# Patient Record
Sex: Male | Born: 1955 | Race: White | Hispanic: No | Marital: Married | Smoking: Former smoker
Health system: Southern US, Community
[De-identification: ages and names within clinical notes are randomized; demographics above are authoritative.]

## PROBLEM LIST (undated history)

## (undated) DIAGNOSIS — F32A Depression, unspecified: Secondary | ICD-10-CM

## (undated) DIAGNOSIS — E039 Hypothyroidism, unspecified: Secondary | ICD-10-CM

## (undated) DIAGNOSIS — E559 Vitamin D deficiency, unspecified: Secondary | ICD-10-CM

## (undated) DIAGNOSIS — E785 Hyperlipidemia, unspecified: Secondary | ICD-10-CM

## (undated) HISTORY — DX: Depression, unspecified: F32.A

## (undated) HISTORY — PX: BACK SURGERY: SHX140

---
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
## 2018-09-03 DIAGNOSIS — E785 Hyperlipidemia, unspecified: Secondary | ICD-10-CM | POA: Diagnosis not present

## 2018-09-03 DIAGNOSIS — E559 Vitamin D deficiency, unspecified: Secondary | ICD-10-CM | POA: Diagnosis not present

## 2018-09-03 DIAGNOSIS — Z125 Encounter for screening for malignant neoplasm of prostate: Secondary | ICD-10-CM | POA: Diagnosis not present

## 2018-09-03 DIAGNOSIS — R5383 Other fatigue: Secondary | ICD-10-CM | POA: Diagnosis not present

## 2018-09-03 DIAGNOSIS — Z0389 Encounter for observation for other suspected diseases and conditions ruled out: Secondary | ICD-10-CM | POA: Diagnosis not present

## 2018-09-03 DIAGNOSIS — Z0000 Encounter for general adult medical examination without abnormal findings: Secondary | ICD-10-CM | POA: Diagnosis not present

## 2018-10-02 ENCOUNTER — Ambulatory Visit (HOSPITAL_COMMUNITY)
Admission: RE | Admit: 2018-10-02 | Discharge: 2018-10-02 | Disposition: A | Discharge status: Home / Self Care | Payer: Federal, State, Local not specified - PPO | Source: Ambulatory Visit | Attending: Internal Medicine | Admitting: Internal Medicine

## 2018-10-02 ENCOUNTER — Other Ambulatory Visit (HOSPITAL_COMMUNITY): Payer: Self-pay | Admitting: Internal Medicine

## 2018-10-02 DIAGNOSIS — M549 Dorsalgia, unspecified: Secondary | ICD-10-CM

## 2018-10-02 DIAGNOSIS — M4726 Other spondylosis with radiculopathy, lumbar region: Secondary | ICD-10-CM | POA: Diagnosis not present

## 2018-12-17 ENCOUNTER — Ambulatory Visit (INDEPENDENT_AMBULATORY_CARE_PROVIDER_SITE_OTHER): Payer: Federal, State, Local not specified - PPO | Admitting: Internal Medicine

## 2018-12-17 DIAGNOSIS — R5383 Other fatigue: Secondary | ICD-10-CM | POA: Diagnosis not present

## 2018-12-17 DIAGNOSIS — R7302 Impaired glucose tolerance (oral): Secondary | ICD-10-CM | POA: Diagnosis not present

## 2018-12-17 DIAGNOSIS — E559 Vitamin D deficiency, unspecified: Secondary | ICD-10-CM | POA: Diagnosis not present

## 2018-12-17 DIAGNOSIS — E785 Hyperlipidemia, unspecified: Secondary | ICD-10-CM | POA: Diagnosis not present

## 2018-12-23 DIAGNOSIS — E559 Vitamin D deficiency, unspecified: Secondary | ICD-10-CM | POA: Diagnosis not present

## 2018-12-23 DIAGNOSIS — E039 Hypothyroidism, unspecified: Secondary | ICD-10-CM | POA: Diagnosis not present

## 2018-12-31 DIAGNOSIS — M549 Dorsalgia, unspecified: Secondary | ICD-10-CM | POA: Diagnosis not present

## 2019-02-05 DIAGNOSIS — E785 Hyperlipidemia, unspecified: Secondary | ICD-10-CM | POA: Diagnosis not present

## 2019-02-05 DIAGNOSIS — M549 Dorsalgia, unspecified: Secondary | ICD-10-CM | POA: Diagnosis not present

## 2019-02-05 DIAGNOSIS — E559 Vitamin D deficiency, unspecified: Secondary | ICD-10-CM | POA: Diagnosis not present

## 2019-02-05 DIAGNOSIS — E039 Hypothyroidism, unspecified: Secondary | ICD-10-CM | POA: Diagnosis not present

## 2019-05-14 ENCOUNTER — Encounter (INDEPENDENT_AMBULATORY_CARE_PROVIDER_SITE_OTHER): Payer: Self-pay | Admitting: Internal Medicine

## 2019-05-14 ENCOUNTER — Other Ambulatory Visit: Payer: Self-pay

## 2019-05-14 ENCOUNTER — Ambulatory Visit (INDEPENDENT_AMBULATORY_CARE_PROVIDER_SITE_OTHER): Payer: Federal, State, Local not specified - PPO | Admitting: Internal Medicine

## 2019-05-14 VITALS — BP 136/80 | HR 68 | Temp 98.4°F | Ht 70.0 in | Wt 173.0 lb

## 2019-05-14 DIAGNOSIS — E039 Hypothyroidism, unspecified: Secondary | ICD-10-CM

## 2019-05-14 DIAGNOSIS — E782 Mixed hyperlipidemia: Secondary | ICD-10-CM

## 2019-05-14 DIAGNOSIS — Z1159 Encounter for screening for other viral diseases: Secondary | ICD-10-CM | POA: Diagnosis not present

## 2019-05-14 DIAGNOSIS — F5101 Primary insomnia: Secondary | ICD-10-CM | POA: Diagnosis not present

## 2019-05-14 DIAGNOSIS — E785 Hyperlipidemia, unspecified: Secondary | ICD-10-CM

## 2019-05-14 DIAGNOSIS — E559 Vitamin D deficiency, unspecified: Secondary | ICD-10-CM

## 2019-05-14 HISTORY — DX: Vitamin D deficiency, unspecified: E55.9

## 2019-05-14 HISTORY — DX: Hypothyroidism, unspecified: E03.9

## 2019-05-14 HISTORY — DX: Hyperlipidemia, unspecified: E78.5

## 2019-05-14 NOTE — Progress Notes (Signed)
   Wellness Office Visit  Subjective:  Patient ID: Benjamin Goodwin, male    DOB: 1956-01-02  Age: 63 y.o. MRN: [id number]  CC: This man comes in for follow-up of hyperlipidemia, hypothyroidism and vitamin D deficiency. HPI  He is tolerating desiccated NP thyroid for his hypothyroidism without any problems. He continues with vitamin D3 supplementation for vitamin D deficiency. His hyperlipidemia may have required statin therapy but he was not very keen to go onto this and quite frankly he has no coronary artery disease or cerebrovascular disease nor is he a diabetic to warrant this. He denies any chest pain, dyspnea or palpitations. He does have history of insomnia for which he takes trazodone in part.  He also was taking trazodone for episodes of low mood.  This is not been a problem for some time now. Past Medical History:  Diagnosis Date  . HLD (hyperlipidemia) 05/14/2019  . Hypothyroidism, adult 05/14/2019  . Vitamin D deficiency disease 05/14/2019      History reviewed. No pertinent family history.  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     Current Meds  Medication Sig  . Cholecalciferol (VITAMIN D-3) 125 MCG (5000 UT) TABS Take 3 tablets by mouth daily.  . NP THYROID 60 MG tablet Take 60 mg by mouth daily before breakfast.       Objective:   Today's Vitals: BP 136/80 (BP Location: Left Arm, Patient Position: Sitting, Cuff Size: Normal)   Pulse 68   Temp 98.4 F (36.9 C) (Temporal)   Ht 5\' 10"  (1.778 m)   Wt 173 lb (78.5 kg)   BMI 24.82 kg/m  Vitals with BMI 05/14/2019  Height 5\' 10"   Weight 173 lbs  BMI 23.76  Systolic 283  Diastolic 80  Pulse 68     Physical Exam    He looks systemically well.  Alert and orientated without any focal neurological signs.   Assessment   1. Primary insomnia   2. Vitamin D deficiency disease   3. Mixed hyperlipidemia   4.  Hypothyroidism, adult       Tests ordered Orders Placed This Encounter  Procedures  . COMPLETE METABOLIC PANEL WITH GFR  . Lipid panel  . T3, free  . TSH  . VITAMIN D 25 Hydroxy (Vit-D Deficiency, Fractures)     Plan: 1. For his insomnia I recommended over-the-counter melatonin from [facility].  I explained the benefits of melatonin not just for insomnia but also for improved immunity and its cardioprotective effects. 2. He will continue with vitamin D3 supplementation and we will check levels today. 3. We will check his lipid panel today to see if there is improvement. 4. He will continue with desiccated thyroid and we will check levels today to see if we need to adjust the dose. 5. Further recommendations will depend on blood results and I will see him for his annual physical exam in 3 months time.     Doree Albee, MD

## 2019-05-14 NOTE — Patient Instructions (Signed)
[email or web address]  MELATONIN 1mg  capsules (#60 in a bottle)

## 2019-05-15 ENCOUNTER — Other Ambulatory Visit (INDEPENDENT_AMBULATORY_CARE_PROVIDER_SITE_OTHER): Payer: Self-pay | Admitting: Internal Medicine

## 2019-05-15 LAB — COMPLETE METABOLIC PANEL WITHOUT GFR
AG Ratio: 2.2 (calc) (ref 1.0–2.5)
ALT: 13 U/L (ref 9–46)
AST: 18 U/L (ref 10–35)
Albumin: 4.6 g/dL (ref 3.6–5.1)
Alkaline phosphatase (APISO): 63 U/L (ref 35–144)
BUN: 10 mg/dL (ref 7–25)
CO2: 30 mmol/L (ref 20–32)
Calcium: 9.2 mg/dL (ref 8.6–10.3)
Chloride: 102 mmol/L (ref 98–110)
Creat: 0.94 mg/dL (ref 0.70–1.25)
GFR, Est African American: 100 mL/min/1.73m2
GFR, Est Non African American: 86 mL/min/1.73m2
Globulin: 2.1 g/dL (ref 1.9–3.7)
Glucose, Bld: 92 mg/dL (ref 65–99)
Potassium: 4.5 mmol/L (ref 3.5–5.3)
Sodium: 139 mmol/L (ref 135–146)
Total Bilirubin: 0.3 mg/dL (ref 0.2–1.2)
Total Protein: 6.7 g/dL (ref 6.1–8.1)

## 2019-05-15 LAB — LIPID PANEL
Cholesterol: 257 mg/dL — ABNORMAL HIGH
HDL: 69 mg/dL
LDL Cholesterol (Calc): 164 mg/dL — ABNORMAL HIGH
Non-HDL Cholesterol (Calc): 188 mg/dL — ABNORMAL HIGH
Total CHOL/HDL Ratio: 3.7 (calc)
Triglycerides: 122 mg/dL

## 2019-05-15 LAB — VITAMIN D 25 HYDROXY (VIT D DEFICIENCY, FRACTURES): Vit D, 25-Hydroxy: 72 ng/mL (ref 30–100)

## 2019-05-15 LAB — TSH: TSH: 0.83 mIU/L (ref 0.40–4.50)

## 2019-05-15 LAB — T3, FREE: T3, Free: 3.6 pg/mL (ref 2.3–4.2)

## 2019-05-15 MED ORDER — THYROID 90 MG PO TABS: 90.0000 mg | ORAL_TABLET | Freq: Every day | ORAL | 3 refills | Status: DC

## 2019-07-13 ENCOUNTER — Other Ambulatory Visit (INDEPENDENT_AMBULATORY_CARE_PROVIDER_SITE_OTHER): Payer: Self-pay | Admitting: Internal Medicine

## 2019-09-08 ENCOUNTER — Encounter (INDEPENDENT_AMBULATORY_CARE_PROVIDER_SITE_OTHER): Payer: Federal, State, Local not specified - PPO | Admitting: Internal Medicine

## 2019-10-29 DIAGNOSIS — H35341 Macular cyst, hole, or pseudohole, right eye: Secondary | ICD-10-CM | POA: Diagnosis not present

## 2019-10-29 DIAGNOSIS — H35371 Puckering of macula, right eye: Secondary | ICD-10-CM | POA: Diagnosis not present

## 2019-10-29 DIAGNOSIS — H52203 Unspecified astigmatism, bilateral: Secondary | ICD-10-CM | POA: Diagnosis not present

## 2019-10-29 DIAGNOSIS — H2513 Age-related nuclear cataract, bilateral: Secondary | ICD-10-CM | POA: Diagnosis not present

## 2019-11-09 ENCOUNTER — Other Ambulatory Visit (INDEPENDENT_AMBULATORY_CARE_PROVIDER_SITE_OTHER): Payer: Self-pay | Admitting: Nurse Practitioner

## 2019-11-09 ENCOUNTER — Other Ambulatory Visit (INDEPENDENT_AMBULATORY_CARE_PROVIDER_SITE_OTHER): Payer: Self-pay | Admitting: Internal Medicine

## 2019-12-07 ENCOUNTER — Other Ambulatory Visit: Payer: Self-pay

## 2019-12-07 ENCOUNTER — Encounter (INDEPENDENT_AMBULATORY_CARE_PROVIDER_SITE_OTHER): Payer: Self-pay | Admitting: Internal Medicine

## 2019-12-07 ENCOUNTER — Ambulatory Visit (INDEPENDENT_AMBULATORY_CARE_PROVIDER_SITE_OTHER): Payer: Federal, State, Local not specified - PPO | Admitting: Internal Medicine

## 2019-12-07 VITALS — BP 120/70 | HR 71 | Temp 97.5°F | Resp 18 | Ht 69.0 in | Wt 177.6 lb

## 2019-12-07 DIAGNOSIS — E039 Hypothyroidism, unspecified: Secondary | ICD-10-CM | POA: Diagnosis not present

## 2019-12-07 DIAGNOSIS — Z125 Encounter for screening for malignant neoplasm of prostate: Secondary | ICD-10-CM | POA: Diagnosis not present

## 2019-12-07 DIAGNOSIS — R5381 Other malaise: Secondary | ICD-10-CM

## 2019-12-07 DIAGNOSIS — E782 Mixed hyperlipidemia: Secondary | ICD-10-CM | POA: Diagnosis not present

## 2019-12-07 DIAGNOSIS — E559 Vitamin D deficiency, unspecified: Secondary | ICD-10-CM

## 2019-12-07 DIAGNOSIS — R5383 Other fatigue: Secondary | ICD-10-CM

## 2019-12-07 DIAGNOSIS — Z1211 Encounter for screening for malignant neoplasm of colon: Secondary | ICD-10-CM

## 2019-12-07 DIAGNOSIS — Z1159 Encounter for screening for other viral diseases: Secondary | ICD-10-CM | POA: Diagnosis not present

## 2019-12-07 NOTE — Progress Notes (Signed)
Metrics: Intervention Frequency ACO  Documented Smoking Status Yearly  Screened one or more times in 24 months  Cessation Counseling or  Active cessation medication Past 24 months  Past 24 months   Guideline developer: UpToDate (See UpToDate for funding source) Date Released: 2014       Wellness Office Visit  Subjective:  Patient ID: Benjamin Goodwin, male    DOB: 07-23-1956  Age: 64 y.o. MRN: [id number]  CC: This man comes in for follow-up of hypothyroidism, hyperlipidemia vitamin D deficiency.  He was scheduled to have an annual physical exam today but he did not wish to have one. HPI  He continues on desiccated NP thyroid for his hypothyroidism.  He is tolerating this well. As far as his hyperlipidemia is concerned, he does not tolerate statin medications.  Thankfully, he does not have a history of coronary artery disease or cerebrovascular disease or diabetes. He continues on vitamin D3 supplementation 15,000 units daily for vitamin D deficiency. He has no other specific complaints today. Past Medical History:  Diagnosis Date  . HLD (hyperlipidemia) 05/14/2019  . Hypothyroidism, adult 05/14/2019  . Vitamin D deficiency disease 05/14/2019      History reviewed. No pertinent family history.  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   Social History   Tobacco Use  . Smoking status: Former Smoker    Quit date: 02/09/2018    Years since quitting: 1.8  . Smokeless tobacco: Never Used  Substance Use Topics  . Alcohol use: Yes    Alcohol/week: 4.0 standard drinks    Types: 4 Shots of liquor per week    Current Meds  Medication Sig  . Cholecalciferol (VITAMIN D-3) 125 MCG (5000 UT) TABS Take 3 tablets by mouth daily.  . NP THYROID 90 MG tablet Take 1 tablet by mouth once daily  . traZODone (DESYREL) 150 MG tablet Take 1/2 (one-half) tablet by mouth once daily      Objective:    Today's Vitals: BP 120/70 (BP Location: Right Arm, Patient Position: Sitting, Cuff Size: Normal)   Pulse 71   Temp (!) 97.5 F (36.4 C) (Temporal)   Resp 18   Ht 5\' 9"  (1.753 m)   Wt 177 lb 9.6 oz (80.6 kg)   SpO2 97% Comment: wearing a mask.  BMI 26.23 kg/m  Vitals with BMI 12/07/2019 05/14/2019  Height 5\' 9"  5\' 10"   Weight 177 lbs 10 oz 173 lbs  BMI 54.65 03.54  Systolic 656 812  Diastolic 70 80  Pulse 71 68     Physical Exam   He looks systemically well.  Weight is stable.  Blood pressure is excellent.  No new physical findings.    Assessment   1. Hypothyroidism, adult   2. Mixed hyperlipidemia   3. Vitamin D deficiency disease   4. Encounter for hepatitis C screening test for low risk patient   5. Special screening for malignant neoplasm of prostate   6. Malaise and fatigue   7. Colon cancer screening       Tests ordered Orders Placed This Encounter  Procedures  . Hepatitis C antibody  . CBC  . COMPLETE METABOLIC PANEL WITH GFR  . Lipid panel  . T3, free  . PSA  . VITAMIN D 25 Hydroxy (Vit-D Deficiency, Fractures)  . Fecal Globin By Immunochemistry     Plan: 1. Blood work ordered above. 2. We will also do some screening testing  such as fecal occult blood, PSA. 3. He will continue with desiccated NP thyroid for his hypothyroidism and we will see what the levels are. 4. He will continue with vitamin D3 supplementation for vitamin D deficiency and we will see what the levels are. 5. I will begin to see what his hyperlipidemia is doing now and hopefully does not have to go on any other medications. 6. Further recommendations will depend on blood results and I will see him in 4 months for follow-up.   No orders of the defined types were placed in this encounter.   Wilson Singer, MD

## 2019-12-08 LAB — LIPID PANEL
Cholesterol: 260 mg/dL — ABNORMAL HIGH (ref <200)
HDL: 71 mg/dL (ref >=40)
LDL Cholesterol (Calc): 169 mg/dL (calc) — ABNORMAL HIGH
Non-HDL Cholesterol (Calc): 189 mg/dL (calc) — ABNORMAL HIGH (ref <130)
Total CHOL/HDL Ratio: 3.7 (calc) (ref <5.0)
Triglycerides: 96 mg/dL (ref <150)

## 2019-12-08 LAB — CBC
HCT: 41 % (ref 38.5–50.0)
Hemoglobin: 13.5 g/dL (ref 13.2–17.1)
MCH: 28.8 pg (ref 27.0–33.0)
MCHC: 32.9 g/dL (ref 32.0–36.0)
MCV: 87.4 fL (ref 80.0–100.0)
MPV: 9.6 fL (ref 7.5–12.5)
Platelets: 248 10*3/uL (ref 140–400)
RBC: 4.69 10*6/uL (ref 4.20–5.80)
RDW: 13.7 % (ref 11.0–15.0)
WBC: 5.2 10*3/uL (ref 3.8–10.8)

## 2019-12-08 LAB — COMPLETE METABOLIC PANEL WITH GFR
AG Ratio: 2 (calc) (ref 1.0–2.5)
ALT: 12 U/L (ref 9–46)
AST: 17 U/L (ref 10–35)
Albumin: 4.5 g/dL (ref 3.6–5.1)
Alkaline phosphatase (APISO): 75 U/L (ref 35–144)
BUN: 12 mg/dL (ref 7–25)
CO2: 27 mmol/L (ref 20–32)
Calcium: 9.4 mg/dL (ref 8.6–10.3)
Chloride: 104 mmol/L (ref 98–110)
Creat: 1.03 mg/dL (ref 0.70–1.25)
GFR, Est African American: 89 mL/min/{1.73_m2} (ref >=60)
GFR, Est Non African American: 77 mL/min/{1.73_m2} (ref >=60)
Globulin: 2.2 g/dL (calc) (ref 1.9–3.7)
Glucose, Bld: 96 mg/dL (ref 65–99)
Potassium: 4.2 mmol/L (ref 3.5–5.3)
Sodium: 140 mmol/L (ref 135–146)
Total Bilirubin: 0.3 mg/dL (ref 0.2–1.2)
Total Protein: 6.7 g/dL (ref 6.1–8.1)

## 2019-12-08 LAB — VITAMIN D 25 HYDROXY (VIT D DEFICIENCY, FRACTURES): Vit D, 25-Hydroxy: 102 ng/mL — ABNORMAL HIGH (ref 30–100)

## 2019-12-08 LAB — HEPATITIS C ANTIBODY
Hepatitis C Ab: NONREACTIVE
SIGNAL TO CUT-OFF: 0.02 (ref <1.00)

## 2019-12-08 LAB — PSA: PSA: 0.4 ng/mL (ref <=4.0)

## 2019-12-08 LAB — T3, FREE: T3, Free: 4.5 pg/mL — ABNORMAL HIGH (ref 2.3–4.2)

## 2019-12-08 NOTE — Progress Notes (Signed)
Patient called. GIVEN LAB RESULTS. PT WAS HAPPY TO HERE HE IS DOING GREAT. WILL STOP EATING THE JUNK FOOD  SUCH AS PIZZA, LOADED TACOS.

## 2019-12-08 NOTE — Progress Notes (Signed)
Please call the patient back and tell him that blood tests are stable.  Cholesterol levels are still elevated and he needs to focus on nutrition that we have discussed previously.His thyroid levels are in a good range so we will continue with the same dose of thyroid.  His PSA is normal so no indication of prostate cancer.His vitamin D levels are excellent without increase in calcium levels so he should continue with the same dose of vitamin D3.Follow-up as scheduled.

## 2019-12-10 DIAGNOSIS — Z1211 Encounter for screening for malignant neoplasm of colon: Secondary | ICD-10-CM | POA: Diagnosis not present

## 2019-12-11 LAB — FECAL GLOBIN BY IMMUNOCHEMISTRY: FECAL GLOBIN RESULT:: NOT DETECTED

## 2019-12-11 NOTE — Progress Notes (Signed)
Please call the patient and let him know that his stool card was negative for blood which is great news.

## 2019-12-14 NOTE — Progress Notes (Signed)
Patient called. Pt was given news on the Fecal test. Pt is relieve it was negative.

## 2020-02-10 ENCOUNTER — Other Ambulatory Visit (INDEPENDENT_AMBULATORY_CARE_PROVIDER_SITE_OTHER): Payer: Self-pay | Admitting: Internal Medicine

## 2020-03-17 ENCOUNTER — Telehealth (INDEPENDENT_AMBULATORY_CARE_PROVIDER_SITE_OTHER): Payer: Self-pay

## 2020-03-17 ENCOUNTER — Other Ambulatory Visit (INDEPENDENT_AMBULATORY_CARE_PROVIDER_SITE_OTHER): Payer: Self-pay | Admitting: Internal Medicine

## 2020-03-17 MED ORDER — PREDNISONE 20 MG PO TABS: 40.0000 mg | ORAL_TABLET | Freq: Every day | ORAL | 1 refills | Status: DC

## 2020-03-17 NOTE — Telephone Encounter (Signed)
Okay, I have sent prednisone to the [facility] pharmacy in [place].

## 2020-04-12 ENCOUNTER — Ambulatory Visit (INDEPENDENT_AMBULATORY_CARE_PROVIDER_SITE_OTHER): Payer: Federal, State, Local not specified - PPO | Admitting: Internal Medicine

## 2020-04-12 ENCOUNTER — Encounter (INDEPENDENT_AMBULATORY_CARE_PROVIDER_SITE_OTHER): Payer: Self-pay | Admitting: Internal Medicine

## 2020-04-12 ENCOUNTER — Other Ambulatory Visit: Payer: Self-pay

## 2020-04-12 VITALS — BP 140/80 | HR 65 | Temp 98.1°F | Ht 69.0 in | Wt 178.6 lb

## 2020-04-12 DIAGNOSIS — E782 Mixed hyperlipidemia: Secondary | ICD-10-CM | POA: Diagnosis not present

## 2020-04-12 DIAGNOSIS — E039 Hypothyroidism, unspecified: Secondary | ICD-10-CM

## 2020-04-12 DIAGNOSIS — E559 Vitamin D deficiency, unspecified: Secondary | ICD-10-CM

## 2020-04-12 NOTE — Progress Notes (Signed)
Metrics: Intervention Frequency ACO  Documented Smoking Status Yearly  Screened one or more times in 24 months  Cessation Counseling or  Active cessation medication Past 24 months  Past 24 months   Guideline developer: UpToDate (See UpToDate for funding source) Date Released: 2014       Wellness Office Visit  Subjective:  Patient ID: Benjamin Goodwin, male    DOB: Jan 17, 1956  Age: 64 y.o. MRN: [id number]  CC: This man comes in for follow-up of hypothyroidism, hyperlipidemia and vitamin D deficiency. HPI  He continues on desiccated NP thyroid and his last T3 level was in a very good range. Unfortunately, his total cholesterol was still elevated.  He tells me he eats animal protein at least 4 times a week. He continues on vitamin D3 for vitamin D deficiency. Past Medical History:  Diagnosis Date  . HLD (hyperlipidemia) 05/14/2019  . Hypothyroidism, adult 05/14/2019  . Vitamin D deficiency disease 05/14/2019   History reviewed. No pertinent surgical history.   History reviewed. No pertinent family history.  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   Social History   Tobacco Use  . Smoking status: Former Smoker    Quit date: 02/09/2018    Years since quitting: 2.1  . Smokeless tobacco: Never Used  Substance Use Topics  . Alcohol use: Yes    Alcohol/week: 4.0 standard drinks    Types: 4 Shots of liquor per week    Current Meds  Medication Sig  . Cholecalciferol (VITAMIN D-3) 125 MCG (5000 UT) TABS Take 3 tablets by mouth daily.  . Coenzyme Q10 (CO Q-10) 100 MG CAPS Take 200 mg by mouth in the morning.  . NP THYROID 90 MG tablet Take 1 tablet by mouth once daily  . traZODone (DESYREL) 150 MG tablet Take 1/2 (one-half) tablet by mouth once daily  . Turmeric (QC TUMERIC COMPLEX PO) Take 3,000 mg by mouth daily.      Depression screen PHQ 2/9 05/14/2019  Decreased Interest 0  Down,  Depressed, Hopeless 0  PHQ - 2 Score 0     Objective:   Today's Vitals: BP 140/80 (BP Location: Left Arm, Patient Position: Sitting, Cuff Size: Normal)   Pulse 65   Temp 98.1 F (36.7 C) (Temporal)   Ht 5\' 9"  (1.753 m)   Wt 178 lb 9.6 oz (81 kg)   SpO2 97%   BMI 26.37 kg/m  Vitals with BMI 04/12/2020 12/07/2019 05/14/2019  Height 5\' 9"  5\' 9"  5\' 10"   Weight 178 lbs 10 oz 177 lbs 10 oz 173 lbs  BMI 26.36 26.22 24.82  Systolic 140 120 07/14/2019  Diastolic 80 70 80  Pulse 65 71 68     Physical Exam  He looks systemically well.  Weight is stable.  Blood pressure is acceptable for his age.     Assessment   1. Hypothyroidism, adult   2. Mixed hyperlipidemia   3. Vitamin D deficiency disease       Tests ordered No orders of the defined types were placed in this encounter.    Plan: 1. He will continue with the same dose of NP thyroid for his hypothyroidism. 2. He will continue to work on nutrition and we discussed the blue zones today, emphasizing the importance of reducing animal protein intake and replacing this with a plant-based diet, especially beans and nuts on a daily basis. 3. He will continue with vitamin D3 supplementation for  vitamin D deficiency. 4. I will see him in 3 months time for an annual physical exam.   No orders of the defined types were placed in this encounter.   Wilson Singer, MD

## 2020-05-04 ENCOUNTER — Ambulatory Visit (INDEPENDENT_AMBULATORY_CARE_PROVIDER_SITE_OTHER): Payer: Federal, State, Local not specified - PPO | Admitting: Nurse Practitioner

## 2020-05-04 ENCOUNTER — Encounter (INDEPENDENT_AMBULATORY_CARE_PROVIDER_SITE_OTHER): Payer: Self-pay | Admitting: Nurse Practitioner

## 2020-05-04 ENCOUNTER — Other Ambulatory Visit: Payer: Self-pay

## 2020-05-04 VITALS — BP 160/80 | HR 76 | Temp 96.9°F | Resp 12 | Ht 70.0 in | Wt 180.2 lb

## 2020-05-04 DIAGNOSIS — M7501 Adhesive capsulitis of right shoulder: Secondary | ICD-10-CM | POA: Diagnosis not present

## 2020-05-04 MED ORDER — PREDNISONE 20 MG PO TABS: 40.0000 mg | ORAL_TABLET | Freq: Every day | ORAL | 0 refills | Status: DC

## 2020-05-04 NOTE — Progress Notes (Signed)
Subjective:  Patient ID: Benjamin Goodwin, male    DOB: [birth year]  Age: 64 y.o. MRN: [id number]  CC:  Chief Complaint  Patient presents with  . Other    Shoulder pain      HPI  This patient arrives today for the above.  This is an acute visit for the above.  He tells me for the last 2 months or so he is been having pain to the right shoulder and restricted range of motion.  He tells me that the pain has been getting progressively more severe and he has tried gentle range of motion exercises at home as well as work with massage therapist with pain has not improved.  He would like to be evaluated for this.  He denies any numbness, tingling, weakness to the arm.  He tells me he has a hard time reaching backwards to grab his wallet out of his pocket he has a hard time reaching his arm forward in front of him as well as above his head.  Past Medical History:  Diagnosis Date  . HLD (hyperlipidemia) 05/14/2019  . Hypothyroidism, adult 05/14/2019  . Vitamin D deficiency disease 05/14/2019      History reviewed. No pertinent family history.  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   Social History   Tobacco Use  . Smoking status: Former Smoker    Quit date: 02/09/2018    Years since quitting: 2.2  . Smokeless tobacco: Never Used  Substance Use Topics  . Alcohol use: Yes    Alcohol/week: 4.0 standard drinks    Types: 4 Shots of liquor per week     Current Meds  Medication Sig  . Cholecalciferol (VITAMIN D-3) 125 MCG (5000 UT) TABS Take 3 tablets by mouth daily.  . Coenzyme Q10 (CO Q-10) 100 MG CAPS Take 200 mg by mouth in the morning.  . NP THYROID 90 MG tablet Take 1 tablet by mouth once daily  . traZODone (DESYREL) 150 MG tablet Take 1/2 (one-half) tablet by mouth once daily  . Turmeric (QC TUMERIC COMPLEX PO) Take 3,000 mg by mouth daily.    ROS:  See HPI   Objective:    Today's Vitals: BP (!) 160/80   Pulse 76   Temp (!) 96.9 F (36.1 C)   Resp 12   Ht 5\' 10"  (1.778 m)   Wt 180 lb 3.2 oz (81.7 kg)   SpO2 95%   BMI 25.86 kg/m  Vitals with BMI 05/04/2020 04/12/2020 12/07/2019  Height 5\' 10"  5\' 9"  5\' 9"   Weight 180 lbs 3 oz 178 lbs 10 oz 177 lbs 10 oz  BMI 25.86 26.36 26.22  Systolic 160 140 02/06/2020  Diastolic 80 80 70  Pulse 76 65 71     Physical Exam Musculoskeletal:     Right shoulder: Tenderness and crepitus present. No swelling or deformity. Decreased range of motion. Normal strength.     Left shoulder: Crepitus present. No swelling, deformity, effusion, tenderness or bony tenderness. Normal range of motion. Normal strength.     Comments: Range of motion reduced with forward flexion, internal rotation, abduction, and abduction          Assessment and Plan   1. Adhesive capsulitis of right shoulder      Plan: 1.  I believe he has adhesive capsulitis of the right shoulder.  Will refer to orthopedics physician for assistance with management.  Will initiate patient on short course of steroids.  He was counseled about possible adverse effects and what to do these were to occur.   Tests ordered Orders Placed This Encounter  Procedures  . Ambulatory referral to Orthopedic Surgery      Meds ordered this encounter  Medications  . DISCONTD: predniSONE (DELTASONE) 20 MG tablet    Sig: Take 2 tablets (40 mg total) by mouth daily with breakfast.    Dispense:  10 tablet    Refill:  0    Order Specific Question:   Supervising Provider    Answer:   Lilly Cove C [[id number]]  . predniSONE (DELTASONE) 20 MG tablet    Sig: Take 2 tablets (40 mg total) by mouth daily with breakfast.    Dispense:  10 tablet    Refill:  0    Order Specific Question:   Supervising Provider    Answer:   Wilson Singer [[id number]]    Patient to follow-up as scheduled in December or sooner as needed  Elenore Paddy, NP

## 2020-05-14 ENCOUNTER — Other Ambulatory Visit (INDEPENDENT_AMBULATORY_CARE_PROVIDER_SITE_OTHER): Payer: Self-pay | Admitting: Internal Medicine

## 2020-07-18 ENCOUNTER — Encounter (INDEPENDENT_AMBULATORY_CARE_PROVIDER_SITE_OTHER): Payer: Self-pay | Admitting: Internal Medicine

## 2020-07-18 ENCOUNTER — Ambulatory Visit (INDEPENDENT_AMBULATORY_CARE_PROVIDER_SITE_OTHER): Payer: Federal, State, Local not specified - PPO | Admitting: Internal Medicine

## 2020-07-18 ENCOUNTER — Other Ambulatory Visit: Payer: Self-pay

## 2020-07-18 VITALS — BP 140/82 | HR 80 | Temp 97.7°F | Ht 69.0 in | Wt 177.8 lb

## 2020-07-18 DIAGNOSIS — E039 Hypothyroidism, unspecified: Secondary | ICD-10-CM | POA: Diagnosis not present

## 2020-07-18 DIAGNOSIS — F5101 Primary insomnia: Secondary | ICD-10-CM | POA: Diagnosis not present

## 2020-07-18 DIAGNOSIS — E782 Mixed hyperlipidemia: Secondary | ICD-10-CM | POA: Diagnosis not present

## 2020-07-18 MED ORDER — TRAZODONE HCL 150 MG PO TABS
ORAL_TABLET | ORAL | 2 refills | Status: DC
Start: 2020-07-18 — End: 2021-01-23

## 2020-07-18 NOTE — Progress Notes (Addendum)
Metrics: Intervention Frequency ACO  Documented Smoking Status Yearly  Screened one or more times in 24 months  Cessation Counseling or  Active cessation medication Past 24 months  Past 24 months   Guideline developer: UpToDate (See UpToDate for funding source) Date Released: 2014       Wellness Office Visit  Subjective:  Patient ID: Benjamin Goodwin, male    DOB: September 29, 1955  Age: 64 y.o. MRN: [id number]  CC: This man was scheduled for an annual physical exam but he really does not wish to have one today, he is somewhat wary of this from his past. We converted the visit into a follow-up visit regarding his hypothyroidism and hyperlipidemia. HPI  He was seen by Maralyn Sago couple of months ago with right shoulder pain and steroid significantly have helped him.  He is, according to him, 90% better.  He did not see orthopedics in the end. He continues with desiccated NP thyroid for hypothyroidism and is tolerating this dose well. He takes trazodone for insomnia and degree of depression and he needs a refill because he had been using more trazodone with the right shoulder pain. He has hyperlipidemia has not been treated with statin therapy by me.  He does not have a history of coronary artery disease or cerebrovascular disease. Past Medical History:  Diagnosis Date  . HLD (hyperlipidemia) 05/14/2019  . Hypothyroidism, adult 05/14/2019  . Vitamin D deficiency disease 05/14/2019   History reviewed. No pertinent surgical history.   History reviewed. No pertinent family history.  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   Social History   Tobacco Use  . Smoking status: Former Smoker    Quit date: 02/09/2018    Years since quitting: 2.4  . Smokeless tobacco: Never Used  Substance Use Topics  . Alcohol use: Yes    Alcohol/week: 4.0 standard drinks    Types: 4 Shots of liquor per week    Current Meds   Medication Sig  . Cholecalciferol (VITAMIN D-3) 125 MCG (5000 UT) TABS Take 3 tablets by mouth daily.  . Coenzyme Q10 (CO Q-10) 100 MG CAPS Take 200 mg by mouth in the morning.  . NP THYROID 90 MG tablet Take 1 tablet by mouth once daily  . Turmeric (QC TUMERIC COMPLEX PO) Take 2,000 mg by mouth daily.  . [DISCONTINUED] traZODone (DESYREL) 150 MG tablet Take 1/2 (one-half) tablet by mouth once daily      Depression screen [facility] 2/9 07/18/2020 05/14/2019  Decreased Interest 1 0  Down, Depressed, Hopeless 1 0  PHQ - 2 Score 2 0  Altered sleeping 1 -  Tired, decreased energy 1 -  Change in appetite 0 -  Feeling bad or failure about yourself  0 -  Trouble concentrating 0 -  Moving slowly or fidgety/restless 0 -  Suicidal thoughts 0 -  PHQ-9 Score 4 -  Difficult doing work/chores Somewhat difficult -     Objective:   Today's Vitals: BP 140/82   Pulse 80   Temp 97.7 F (36.5 C) (Temporal)   Ht 5\' 9"  (1.753 m)   Wt 177 lb 12.8 oz (80.6 kg)   SpO2 96%   BMI 26.26 kg/m  Vitals with BMI 07/18/2020 05/04/2020 04/12/2020  Height 5\' 9"  5\' 10"  5\' 9"   Weight 177 lbs 13 oz 180 lbs 3 oz 178 lbs 10 oz  BMI 26.24 25.86 26.36  Systolic 140 160 06/12/2020  Diastolic 82  80 80  Pulse 80 76 65     Physical Exam  He looks systemically well.  His lost about 3 pounds since September.  Blood pressure somewhat better.  He gets anxious coming to the office.     Assessment   1. Mixed hyperlipidemia   2. Hypothyroidism, adult   3. Primary insomnia       Tests ordered Orders Placed This Encounter  Procedures  . Lipid panel     Plan: 1. He will continue with the same dose of desiccated NP thyroid.  His last free T3 levels were in good range. 2. [email or web address] 3. Lipid panels will be checked today. 4. I have refilled his trazodone for his insomnia and degree of depression. 5. Follow-up in about 5 months.  Meds ordered this encounter  Medications   . traZODone (DESYREL) 150 MG tablet    Sig: Take 1/2 (one-half) tablet by mouth once daily    Dispense:  45 tablet    Refill:  2    Spenser Cong Normajean Glasgow, MD

## 2020-07-19 LAB — LIPID PANEL
Cholesterol: 242 mg/dL — ABNORMAL HIGH (ref <200)
HDL: 76 mg/dL (ref >=40)
LDL Cholesterol (Calc): 150 mg/dL (calc) — ABNORMAL HIGH
Non-HDL Cholesterol (Calc): 166 mg/dL (calc) — ABNORMAL HIGH (ref <130)
Total CHOL/HDL Ratio: 3.2 (calc) (ref <5.0)
Triglycerides: 68 mg/dL (ref <150)

## 2020-07-19 NOTE — Progress Notes (Signed)
Please call the patient and let him know his cholesterol has improved compared to last time.  Continue to focus on nutrition and continue with all the same medications as before.  Follow-up as scheduled.

## 2020-07-20 NOTE — Progress Notes (Signed)
Pt was called and given lab results. Pt was given praises and encouragement to continue the plant base meals, more beans, water. Agree with instructions, was happy it was getting better.

## 2020-08-05 ENCOUNTER — Other Ambulatory Visit (INDEPENDENT_AMBULATORY_CARE_PROVIDER_SITE_OTHER): Payer: Self-pay | Admitting: Internal Medicine

## 2020-08-16 IMAGING — DX DG THORACIC SPINE 3V
3 series · 3 of 3 positions shown · non-contrast
Comparison: Lumbar spine 10/02/2018

CLINICAL DATA: 62-year-old with mid back pain.  Dorsalgia.

EXAM:
THORACIC SPINE - 3 VIEWS

[t-spine ap]
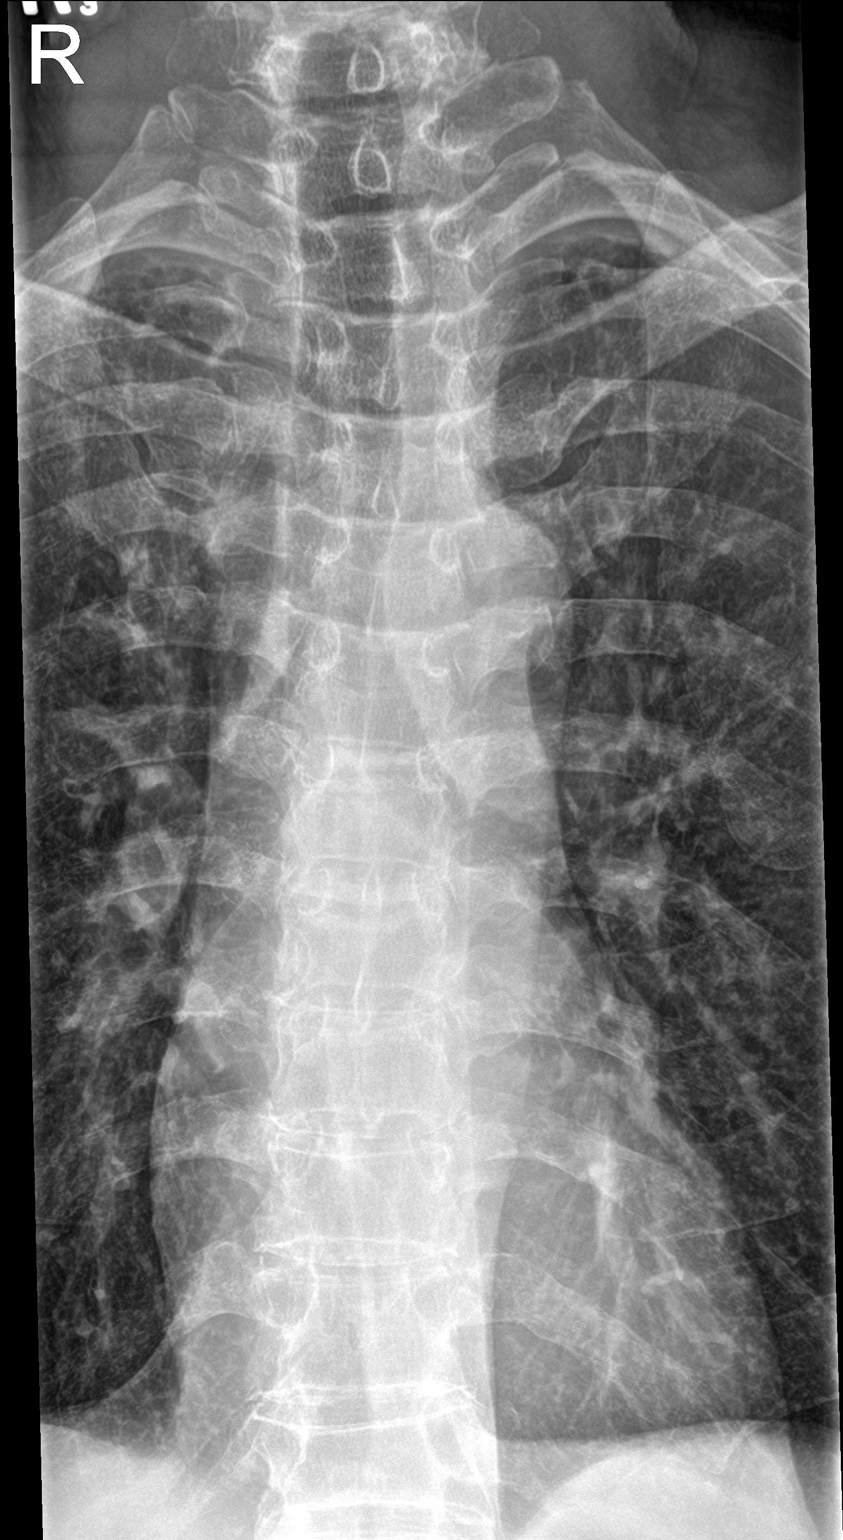

[t-spine lat]
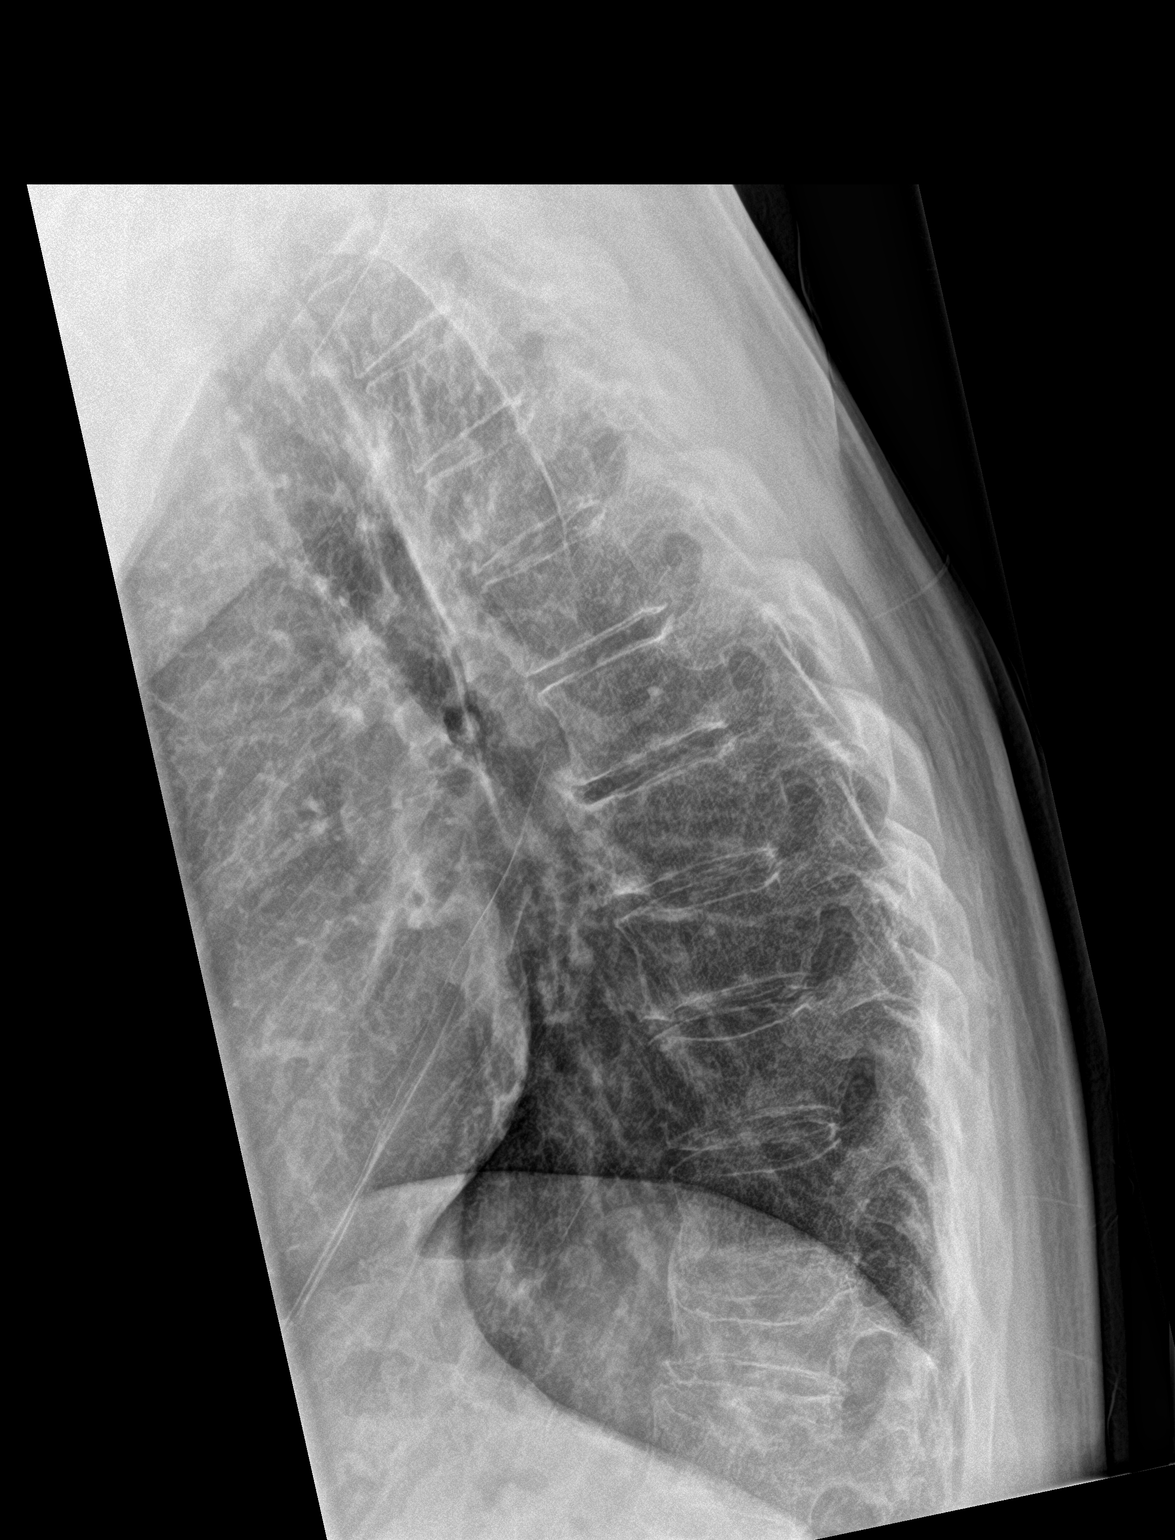

[t-spine swimmers]
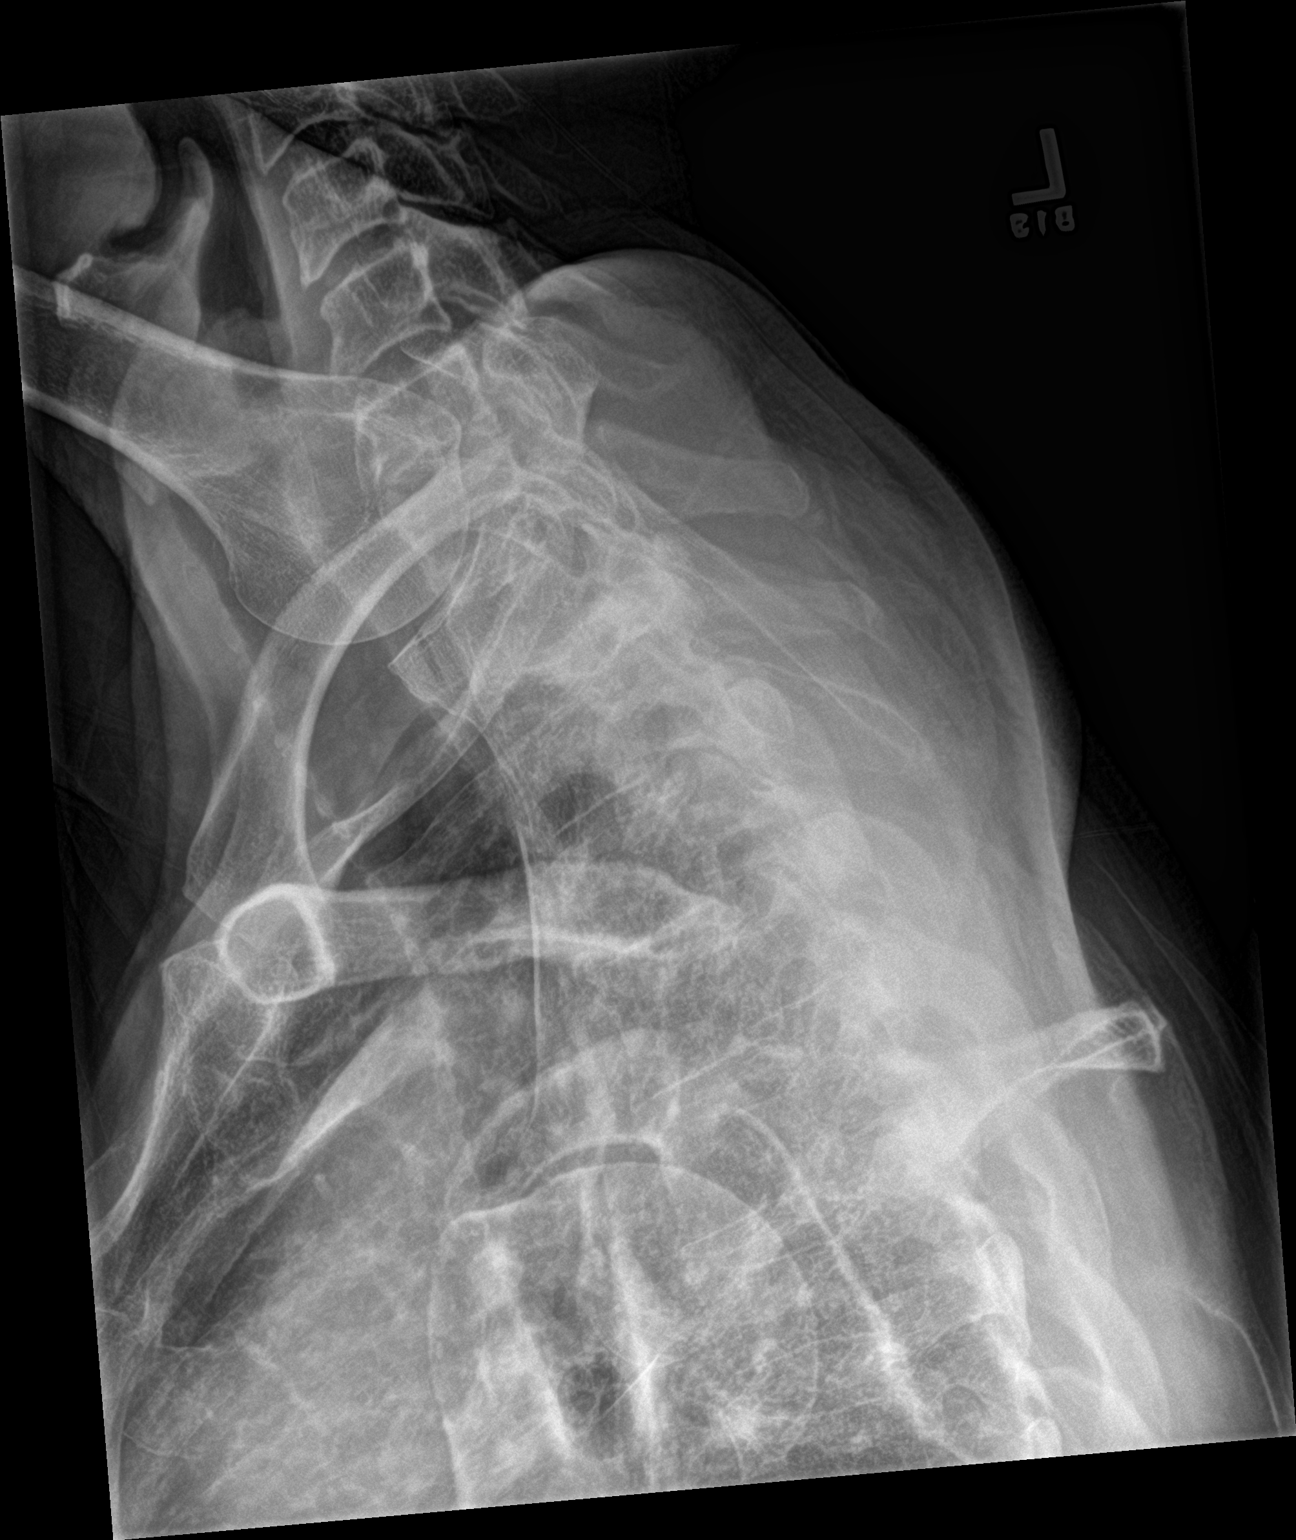

[3 of 3 positions shown; findings below may reference images not displayed]

FINDINGS: Normal alignment of the thoracic spine including the cervicothoracic
junction. Compression deformity involving the T12 vertebral body.
The other thoracic vertebral body heights are maintained.
IMPRESSION: Compression deformity of the T12 vertebral body compatible with a
fracture of unknown age. T12 compression deformity is better
characterized on the lumbar spine study from the same date, please
refer to that report.

## 2020-11-02 ENCOUNTER — Other Ambulatory Visit (INDEPENDENT_AMBULATORY_CARE_PROVIDER_SITE_OTHER): Payer: Self-pay | Admitting: Internal Medicine

## 2020-12-19 ENCOUNTER — Ambulatory Visit (INDEPENDENT_AMBULATORY_CARE_PROVIDER_SITE_OTHER): Payer: Federal, State, Local not specified - PPO | Admitting: Internal Medicine

## 2020-12-19 ENCOUNTER — Encounter (INDEPENDENT_AMBULATORY_CARE_PROVIDER_SITE_OTHER): Payer: Self-pay | Admitting: Internal Medicine

## 2020-12-19 ENCOUNTER — Other Ambulatory Visit: Payer: Self-pay

## 2020-12-19 VITALS — BP 140/80 | HR 72 | Ht 69.5 in | Wt 180.2 lb

## 2020-12-19 DIAGNOSIS — E039 Hypothyroidism, unspecified: Secondary | ICD-10-CM | POA: Diagnosis not present

## 2020-12-19 DIAGNOSIS — H938X2 Other specified disorders of left ear: Secondary | ICD-10-CM | POA: Diagnosis not present

## 2020-12-19 DIAGNOSIS — E782 Mixed hyperlipidemia: Secondary | ICD-10-CM | POA: Diagnosis not present

## 2020-12-19 DIAGNOSIS — F5101 Primary insomnia: Secondary | ICD-10-CM

## 2020-12-19 NOTE — Progress Notes (Signed)
Metrics: Intervention Frequency ACO  Documented Smoking Status Yearly  Screened one or more times in 24 months  Cessation Counseling or  Active cessation medication Past 24 months  Past 24 months   Guideline developer: UpToDate (See UpToDate for funding source) Date Released: 2014       Wellness Office Visit  Subjective:  Patient ID: Benjamin Goodwin, male    DOB: Aug 27, 1955  Age: 65 y.o. MRN: [id number]  CC: This man comes in for follow-up of hypothyroidism, hyperlipidemia, insomnia. HPI  He feels he has congestion in the left ear. He continues on NP thyroid for his hypothyroidism. His cholesterol numbers were improving on the last visit. He still has trouble with insomnia and takes trazodone which seems to work well for him. Past Medical History:  Diagnosis Date  . HLD (hyperlipidemia) 05/14/2019  . Hypothyroidism, adult 05/14/2019  . Vitamin D deficiency disease 05/14/2019   History reviewed. No pertinent surgical history.   History reviewed. No pertinent family history.  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   Social History   Tobacco Use  . Smoking status: Former Smoker    Quit date: 02/09/2018    Years since quitting: 2.8  . Smokeless tobacco: Never Used  Substance Use Topics  . Alcohol use: Yes    Alcohol/week: 4.0 standard drinks    Types: 4 Shots of liquor per week    Current Meds  Medication Sig  . Cholecalciferol (VITAMIN D-3) 125 MCG (5000 UT) TABS Take 3 tablets by mouth daily.  . Coenzyme Q10 (CO Q-10) 100 MG CAPS Take 200 mg by mouth in the morning.  . NP THYROID 90 MG tablet Take 1 tablet by mouth once daily  . traZODone (DESYREL) 150 MG tablet Take 1/2 (one-half) tablet by mouth once daily  . Turmeric (QC TUMERIC COMPLEX PO) Take 2,000 mg by mouth daily.     Flowsheet Row Office Visit from 07/18/2020 in [facility]  PHQ-9 Total Score 4       Objective:   Today's Vitals: BP 140/80   Pulse 72   Ht 5' 9.5" (1.765 m)   Wt 180 lb 3.2 oz (81.7 kg)   SpO2 96%   BMI 26.23 kg/m  Vitals with BMI 12/19/2020 07/18/2020 05/04/2020  Height 5' 9.5" 5\' 9"  5\' 10"   Weight 180 lbs 3 oz 177 lbs 13 oz 180 lbs 3 oz  BMI 26.24 26.24 25.86  Systolic 140 140  Diastolic 80 82 80  Pulse 72 80 76     Physical Exam   He looks systemically well.  He has gained a few pounds in weight since last visit.  Blood pressure slightly elevated but acceptable. Examination of his ear canals show that the left tympanic membrane appears to be somewhat more cloudy but not necessarily infectious.  He also has been sneezing.   Assessment   1. Hypothyroidism, adult   2. Mixed hyperlipidemia   3. Congestion of left ear   4. Primary insomnia       Tests ordered Orders Placed This Encounter  Procedures  . Lipid panel     Plan: 1. Continue with NP thyroid 90 mg daily. 2. I will check a lipid panel to see if there is improvement. 3. I recommended over-the-counter Zyrtec as an allergy medicine every night for the next 2 weeks or so to see if this will help his ear symptoms which I suspect may  be due to allergies. 4. We did discuss the use of melatonin and his benefit not just for insomnia. 5. Follow-up in about 6 months for an annual physical exam.   No orders of the defined types were placed in this encounter.   Wilson Singer, MD

## 2020-12-20 LAB — LIPID PANEL
Cholesterol: 228 mg/dL — ABNORMAL HIGH (ref <200)
HDL: 76 mg/dL (ref >=40)
LDL Cholesterol (Calc): 137 mg/dL (calc) — ABNORMAL HIGH
Non-HDL Cholesterol (Calc): 152 mg/dL (calc) — ABNORMAL HIGH (ref <130)
Total CHOL/HDL Ratio: 3 (calc) (ref <5.0)
Triglycerides: 59 mg/dL (ref <150)

## 2021-01-23 ENCOUNTER — Other Ambulatory Visit (INDEPENDENT_AMBULATORY_CARE_PROVIDER_SITE_OTHER): Payer: Self-pay | Admitting: Internal Medicine

## 2021-01-23 ENCOUNTER — Telehealth (INDEPENDENT_AMBULATORY_CARE_PROVIDER_SITE_OTHER): Payer: Self-pay

## 2021-01-23 MED ORDER — TRAZODONE HCL 150 MG PO TABS
ORAL_TABLET | ORAL | 1 refills | Status: DC
Start: 2021-01-23 — End: 2021-01-24

## 2021-01-23 NOTE — Telephone Encounter (Signed)
Received a fax from [facility] pharmacy requesting the following medication to be filled:  traZODone (DESYREL) 150 MG tablet  Last filled 07/18/2020, # 45 with 2 refills  Last OV 12/19/2020  Next OV 06/22/2021

## 2021-01-24 ENCOUNTER — Other Ambulatory Visit (INDEPENDENT_AMBULATORY_CARE_PROVIDER_SITE_OTHER): Payer: Self-pay

## 2021-01-24 MED ORDER — THYROID 90 MG PO TABS: 90.0000 mg | ORAL_TABLET | Freq: Every day | ORAL | 0 refills | Status: DC

## 2021-01-24 MED ORDER — TRAZODONE HCL 150 MG PO TABS: ORAL_TABLET | ORAL | 1 refills | Status: AC

## 2021-01-27 ENCOUNTER — Other Ambulatory Visit (INDEPENDENT_AMBULATORY_CARE_PROVIDER_SITE_OTHER): Payer: Self-pay | Admitting: Internal Medicine

## 2021-01-27 NOTE — Telephone Encounter (Signed)
Looks like you filled 124 days ago.Benjamin Goodwin

## 2021-01-27 NOTE — Telephone Encounter (Signed)
done

## 2021-06-22 ENCOUNTER — Encounter (INDEPENDENT_AMBULATORY_CARE_PROVIDER_SITE_OTHER): Payer: Federal, State, Local not specified - PPO | Admitting: Internal Medicine

## 2024-01-23 ENCOUNTER — Encounter: Payer: Self-pay | Admitting: Gastroenterology

## 2024-02-11 ENCOUNTER — Ambulatory Visit (INDEPENDENT_AMBULATORY_CARE_PROVIDER_SITE_OTHER): Admitting: Gastroenterology

## 2024-02-11 ENCOUNTER — Encounter: Payer: Self-pay | Admitting: Gastroenterology

## 2024-02-11 VITALS — BP 125/71 | HR 79 | Temp 97.8°F | Ht 70.0 in | Wt 158.4 lb

## 2024-02-11 DIAGNOSIS — K641 Second degree hemorrhoids: Secondary | ICD-10-CM | POA: Diagnosis not present

## 2024-02-11 DIAGNOSIS — B3789 Other sites of candidiasis: Secondary | ICD-10-CM | POA: Diagnosis not present

## 2024-02-11 MED ORDER — KETOCONAZOLE 2 % EX CREA: 1.0000 | TOPICAL_CREAM | Freq: Two times a day (BID) | CUTANEOUS | 1 refills | Status: DC

## 2024-02-11 NOTE — Progress Notes (Signed)
 Gastroenterology Office Note    Referring Provider: Hyacinth Honey, NP Primary Care Physician:  Hyacinth Honey, NP  Primary GI: Dr. Cindie    Chief Complaint   Chief Complaint  Patient presents with   Hemorrhoids    Pt arrives to discuss hemorrhoids. Pt states they began internal but eventually moved external. Pt has been using Preperatin H with Lidocaine to help. Pt does state he has a rash around opening. Pt does have burning sensation. When standing, he doesn't feel anything. Pt does have scratching/burning feeling when sitting. PCP did give steroid suppositories. Pt also using Dude Wipes with Witch hazel and drinking fiber BID with meals. Since taking Fiber, pt has lots of gas and using the bathroom more. Bms a     History of Present Illness   Tilford Deaton is a 68 y.o. male presenting today at the request of Hyacinth Honey, NP for consultation due to suspected hemorrhoids. No prior visit with [facility]. Last colonoscopy about 10-15 years ago per patient in [place]  or [place] . Denies any personal history of polyps, and he has no FH colon cancer or polyps.    Last 8 years had 3 episodes of hemorrhoid issues. In past has used cream and resolved. 5 months ago recurrent symptoms. This time had what he felt was an internal hemorrhoid that then popped outside about size of pea. Had increased smearing and wiping.  Used preparation H for about a month. Got suppositories as well. Took for 3 weeks. Didn't seem to be helping. Had itching feeling. Very slight improvement. Saw PCP Honey Hyacinth, NP and placed on prescription suppositories for about 3 months. Has improved significantly from 70% of an issue to now 5% of a problem per his words. Has rash-feeling around anus that is still there. Burning and itching improved.   Drinks fiber drink (Metamucil) twice a day. BM twice in the morning that is smooth, easy to pass. Fiber has helped improve symptoms. No rectal bleeding. No history of straining.  No prolonged sitting. Enjoys fishing and sits doing this for hours. No prolonged toilet time. Started to have lots of gas when drinking fiber but has gotten better. No heavy lifting.   He is not interested in updating screening colonoscopy. He also does not want to return here as he is vastly improved unless he has recurrent symptoms. He does not want to pursue hemorrhoid banding currently but states he will think about this.   [place]  and [place]  colonoscopy. Last 10-15 years ago. No polyps history.  No FH colon cancer or polyps.      Past Medical History:  Diagnosis Date   Depression    HLD (hyperlipidemia) 05/14/2019   Hypothyroidism, adult 05/14/2019   Vitamin D  deficiency disease 05/14/2019    Past Surgical History:  Procedure Laterality Date   BACK SURGERY     compression fracture T12, rods placed and then removed.    Current Outpatient Medications  Medication Sig Dispense Refill   ANUCORT-HC 25 MG suppository Place 25 mg rectally 2 (two) times daily as needed.     Cholecalciferol (VITAMIN D -3) 125 MCG (5000 UT) TABS Take 3 tablets by mouth daily.     Coenzyme Q10 (CO Q-10) 100 MG CAPS Take 200 mg by mouth in the morning.     levothyroxine (SYNTHROID) 25 MCG tablet Take 25 mcg by mouth daily.     lisinopril (ZESTRIL) 20 MG tablet Take 20 mg by mouth daily.     traZODone  (DESYREL ) 150 MG tablet Take  1/2 (one-half) tablet by mouth once daily 45 tablet 1   Turmeric (QC TUMERIC COMPLEX PO) Take 2,000 mg by mouth daily.     No current facility-administered medications for this visit.    Allergies as of 02/11/2024   (No Known Allergies)    Family History  Problem Relation Age of Onset   Colon polyps Neg Hx    Colon cancer Neg Hx     Social History   Socioeconomic History   Marital status: Married    Spouse name: Not on file   Number of children: Not on file   Years of education: Not on file   Highest education level: Not on file  Occupational History   Not  on file  Tobacco Use   Smoking status: Former    Current packs/day: 0.00    Types: Cigarettes    Quit date: 02/10/2018    Years since quitting: 6.0   Smokeless tobacco: Never  Vaping Use   Vaping status: Never Used  Substance and Sexual Activity   Alcohol use: Not Currently    Comment: occasionally.   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   Social Drivers of Corporate investment banker Strain: Not on file  Food Insecurity: Not on file  Transportation Needs: Not on file  Physical Activity: Not on file  Stress: Not on file  Social Connections: Not on file  Intimate Partner Violence: Not on file     Review of Systems   Gen: Denies any fever, chills, fatigue, weight loss, lack of appetite.  CV: Denies chest pain, heart palpitations, peripheral edema, syncope.  Resp: Denies shortness of breath at rest or with exertion. Denies wheezing or cough.  GI: Denies dysphagia or odynophagia. Denies jaundice, hematemesis, fecal incontinence. GU : Denies urinary burning, urinary frequency, urinary hesitancy MS: Denies joint pain, muscle weakness, cramps, or limitation of movement.  Derm: Denies rash, itching, dry skin Psych: Denies depression, anxiety, memory loss, and confusion Heme: Denies bruising, bleeding, and enlarged lymph nodes.   Physical Exam   BP 125/71   Pulse 79   Temp 97.8 F (36.6 C)   Ht 5' 10 (1.778 m)   Wt 158 lb 6.4 oz (71.8 kg)   BMI 22.73 kg/m  General:   Alert and oriented. Pleasant and cooperative. Well-nourished and well-developed.  Head:  Normocephalic and atraumatic. Eyes:  Without icterus Ears:  Normal auditory acuity. Lungs:  Clear to auscultation bilaterally.  Heart:  S1, S2 present without murmurs appreciated.  Abdomen:  +BS, soft, non-tender and non-distended.  No HSM noted. No guarding or rebound. No masses appreciated.  Rectal:   No mass on DRE. Anoscopy with Grade 2 internal hemorrhoids but most prominent right anterior. Yeast perianal.  Msk:  Symmetrical without gross deformities. Normal posture. Extremities:  Without edema. Neurologic:  Alert and  oriented x4;  grossly normal neurologically. Skin:  Intact without significant lesions or rashes. Psych:  Alert and cooperative. Normal mood and affect.   Assessment   Quenten Nawaz is a 68 y.o. male presenting today  at the request of Hyacinth Honey, NP for consultation due to suspected hemorrhoids. No prior visit with [facility]. Last colonoscopy about 10-15 years ago per patient in [place]  or [place] . Denies any personal history of polyps, and he has no  FH colon cancer or polyps.   Symptomatic hemorrhoids: no mass on DRE. I elected to do an anoscopy as well, which showed Grade 2 internal hemorrhoids, no mass, and most prominent was right anterior. He also had perianal yeast. Overall, he has had significant improvement with prolonged course of suppositories. He desires to only return prn if his symptoms recur. He would be an excellent banding candidate, which was discussed today. I also would recommend routine screening colonoscopy, but he would like to defer this.      PLAN   Continue fiber, as this has been helpful with bowel regimen  Avoid straining, limit toilet time  Recommend routine screening colonoscopy if willing in the future  Hemorrhoid banding if patient desires in future. He would rather wait on this and return prn  Will see back as needed at his request.   [name], PhD, ANP-BC [facility]

## 2024-02-11 NOTE — Patient Instructions (Addendum)
 Continue fiber, avoid straining, and limit your toilet sitting time as you are doing!  I have sent in ketoconazole  cream to use around the rectum at the itchy part twice a day until relieved.  Please let me know if you are interested in banding!  We will see you back as needed!  It was a pleasure to see you today. I want to create trusting relationships with patients and provide genuine, compassionate, and quality care. I truly value your feedback, so please be on the lookout for a survey regarding your visit with me today. I appreciate your time in completing this!         [name], PhD, ANP-BC [facility]

## 2024-07-08 ENCOUNTER — Encounter: Payer: Self-pay | Admitting: Gastroenterology

## 2024-07-08 ENCOUNTER — Ambulatory Visit: Admitting: Gastroenterology

## 2024-07-08 VITALS — BP 125/64 | HR 71 | Temp 97.4°F | Ht 70.0 in | Wt 165.7 lb

## 2024-07-08 DIAGNOSIS — K641 Second degree hemorrhoids: Secondary | ICD-10-CM | POA: Diagnosis not present

## 2024-07-08 MED ORDER — CLOTRIMAZOLE 1 % EX CREA: 1.0000 | TOPICAL_CREAM | Freq: Two times a day (BID) | CUTANEOUS | 1 refills | Status: AC

## 2024-07-08 NOTE — Progress Notes (Signed)
 Last colonoscopy     [facility] BANDING PROCEDURE NOTE  Benjamin Goodwin is a 68 y.o. male presenting today for consideration of hemorrhoid banding. Last colonoscopy about 10-15 years ago per patient in [place]  or [place] . Denies any personal history of polyps, and he has no FH colon cancer or polyps.  Anoscopy at visit in July 2025 showing grade 2 internal hemorrhoids, no mass, most prominent polyp was right anterior.  He also had perianal yeast.  He does not want to update any colonoscopy, and he wanted to hold off on banding in July 2025.  He is here due to recurrent bleeding. Cream for yeast has helped, had used in am and pm at first and then just prn. Some days good and some days would flare.   Soft stool, long, occasionally broken up. Taking metamucil daily. No prolapsing tissue. Episode of bleeding recently with straining. He continues to decline colonoscopy.    The patient presents with symptomatic grade 2 hemorrhoids, unresponsive to maximal medical therapy, requesting rubber band ligation of his hemorrhoidal disease. All risks, benefits, and alternative forms of therapy were described and informed consent was obtained.  In the left lateral decubitus position, anoscopic examination revealed grade 2 hemorrhoids in all columns, with left lateral most predominant and appears post-bleeding stigmata of column with healing scab from a month prior.   The decision was made to band the left lateral internal hemorrhoid, and the [facility]an System was used to perform band ligation without complication. Digital anorectal examination was then performed to assure proper positioning of the band, and to adjust the banded tissue as required. The patient was discharged home without pain or other issues. Dietary and behavioral recommendations were given, along with follow-up instructions. The patient will return in several weeks for followup and possible additional banding as required. Instead of Ketoconazole , he  will use clotrimazole  prn yeast, which I sent to pharmacy.   No complications were encountered and the patient tolerated the procedure well.   [name], PhD, ANP-BC [facility]

## 2024-07-08 NOTE — Patient Instructions (Signed)
  Please avoid straining.  You should limit your toilet time to 2-3 minutes at the most.   I have sent in a different cream to use twice a day between buttocks for yeast.   Please call me with any concerns or issues!  I will see you in follow-up for additional banding in several weeks.    I enjoyed seeing you again today! I value our relationship and want to provide genuine, compassionate, and quality care. You may receive a survey regarding your visit with me, and I welcome your feedback! Thanks so much for taking the time to complete this. I look forward to seeing you again.      [name], PhD, ANP-BC [facility]

## 2024-08-13 ENCOUNTER — Encounter: Payer: Self-pay | Admitting: Gastroenterology

## 2024-08-13 ENCOUNTER — Ambulatory Visit (INDEPENDENT_AMBULATORY_CARE_PROVIDER_SITE_OTHER): Admitting: Gastroenterology

## 2024-08-13 VITALS — BP 135/85 | HR 71 | Temp 97.9°F | Ht 69.5 in | Wt 167.6 lb

## 2024-08-13 DIAGNOSIS — B379 Candidiasis, unspecified: Secondary | ICD-10-CM | POA: Insufficient documentation

## 2024-08-13 DIAGNOSIS — B3789 Other sites of candidiasis: Secondary | ICD-10-CM

## 2024-08-13 DIAGNOSIS — K641 Second degree hemorrhoids: Secondary | ICD-10-CM

## 2024-08-13 MED ORDER — HYDROCORTISONE (PERIANAL) 2.5 % EX CREA: 1.0000 | TOPICAL_CREAM | Freq: Two times a day (BID) | CUTANEOUS | 1 refills | Status: AC

## 2024-08-13 MED ORDER — NYSTATIN-TRIAMCINOLONE 100000-0.1 UNIT/GM-% EX CREA: 1.0000 | TOPICAL_CREAM | Freq: Two times a day (BID) | CUTANEOUS | 1 refills | Status: AC

## 2024-08-13 NOTE — Patient Instructions (Signed)
 I have sent in hydrocortisone  (Anusol ) cream to use internally per rectum twice a day for 5-7 days, then give a break. This helps calm down the internal hemorrhoids.  For the outside, you can use Mycolog cream twice a day for the next 1-2 weeks. Then, take a break and see how things go.   Try to keep the area dry, use unscented soap when washing.  We can band again in the future!  Please call with any concerns in the meantime!  I enjoyed seeing you again today! I value our relationship and want to provide genuine, compassionate, and quality care. You may receive a survey regarding your visit with me, and I welcome your feedback! Thanks so much for taking the time to complete this. I look forward to seeing you again.      [name], PhD, ANP-BC [facility]

## 2024-08-13 NOTE — Progress Notes (Signed)
 "      Gastroenterology Office Note     Primary Care Physician:  Shona Norleen PEDLAR, MD  Primary Gastroenterologist:  Dr Cindie   Chief Complaint   Chief Complaint  Patient presents with   Hemorrhoids    Still having issues with hemorrhoids. Does not want to have a banding today. Wants recommendations on what he can use for irration.      History of Present Illness   Benjamin Goodwin is a 69 y.o. male presenting today with a history of symptomatic hemorrhoids, perianal yeast, last seen in Dec 2025 and undergoing banding of left lateral.  Last colonoscopy about 10-15 years ago per patient in [place]  or [place] . Denies any personal history of polyps, and he has no FH colon cancer or polyps. He has declined updated colonoscopy.   Grade 2 internal hemorrhoids s/p banding of left lateral at visit in Dec 2025. Has dealt with perianal yeast. Has taken ketoconazole  in the past. I sent in clotrimazole  at last visit, which seems to help. Fecal seepage is much better. Happens once or twice a month. Ketoconazole  without much improvement in the past. Would like to save banding for more extreme issues. No rectal pain or pressure. Rash feeling every day.   Got hemorrhoid wipes, which has calmed down the outside. Taking fiber drink, prunes. No straining.   Declining rectal exam today.    Past Medical History:  Diagnosis Date   Depression    HLD (hyperlipidemia) 05/14/2019   Hypothyroidism, adult 05/14/2019   Vitamin D  deficiency disease 05/14/2019    Past Surgical History:  Procedure Laterality Date   BACK SURGERY     compression fracture T12, rods placed and then removed.    Current Outpatient Medications  Medication Sig Dispense Refill   ANUCORT-HC  25 MG suppository Place 25 mg rectally 2 (two) times daily as needed. (Patient taking differently: Place 25 mg rectally 2 (two) times daily as needed. PRN)     Cholecalciferol (VITAMIN D -3) 125 MCG (5000 UT) TABS Take 3 tablets by mouth  daily.     clotrimazole  (CLOTRIMAZOLE  ANTI-FUNGAL) 1 % cream Apply 1 Application topically 2 (two) times daily. Between buttocks to rash 30 g 1   Coenzyme Q10 (CO Q-10) 100 MG CAPS Take 200 mg by mouth in the morning.     hydrocortisone  (ANUSOL -HC) 2.5 % rectal cream Place 1 Application rectally 2 (two) times daily. Per rectum 30 g 1   hydrocortisone  (ANUSOL -HC) 2.5 % rectal cream Place 1 Application rectally 2 (two) times daily. Inside rectum for hemorrhoid inflammation up to 5 days at a time. 30 g 1   levothyroxine (SYNTHROID) 25 MCG tablet Take 25 mcg by mouth daily.     nystatin -triamcinolone  (MYCOLOG II) cream Apply 1 Application topically 2 (two) times daily. Around rectum for rash twice a day 60 g 1   traZODone  (DESYREL ) 150 MG tablet Take 1/2 (one-half) tablet by mouth once daily 45 tablet 1   Turmeric (QC TUMERIC COMPLEX PO) Take 2,000 mg by mouth daily.     lisinopril (ZESTRIL) 20 MG tablet Take 20 mg by mouth daily.     No current facility-administered medications for this visit.    Allergies as of 08/13/2024   (No Known Allergies)    Family History  Problem Relation Age of Onset   Colon polyps Neg Hx    Colon cancer Neg Hx     Social History   Socioeconomic History   Marital status: Married    Spouse name:  Not on file   Number of children: Not on file   Years of education: Not on file   Highest education level: Not on file  Occupational History   Not on file  Tobacco Use   Smoking status: Former    Current packs/day: 0.00    Types: Cigarettes    Quit date: 02/10/2018    Years since quitting: 6.5   Smokeless tobacco: Never  Vaping Use   Vaping status: Never Used  Substance and Sexual Activity   Alcohol use: Not Currently    Comment: occasionally.   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   Social Drivers of Health   Tobacco Use: Medium Risk (08/13/2024)   Patient History    Smoking Tobacco Use: Former    Smokeless Tobacco Use: Never    Passive Exposure: Not on Actuary Strain: Not on file  Food Insecurity: Not on file  Transportation Needs: Not on file  Physical Activity: Not on file  Stress: Not on file  Social Connections: Not on file  Intimate Partner Violence: Not on file  Depression (EYV7-0): Not on file  Alcohol Screen: Not on file  Housing: Not on file  Utilities: Not on file  Health Literacy: Not on file     Review of Systems   Gen: Denies any fever, chills, fatigue, weight loss, lack of appetite.  CV: Denies chest pain, heart palpitations, peripheral edema, syncope.  Resp: Denies shortness of breath at rest or with exertion. Denies wheezing or cough.  GI: Denies dysphagia or odynophagia. Denies jaundice, hematemesis, fecal incontinence. GU : Denies urinary burning, urinary frequency, urinary hesitancy MS: Denies joint pain, muscle weakness, cramps, or limitation of movement.  Derm: Denies rash, itching, dry skin Psych: Denies depression, anxiety, memory loss, and confusion Heme: Denies bruising, bleeding, and enlarged lymph nodes.   Physical Exam   BP 135/85 (BP Location: Right Arm, Patient Position: Sitting, Cuff Size: Normal)   Pulse 71   Temp 97.9 F (36.6 C) (Temporal)   Ht 5' 9.5 (1.765 m)   Wt 167 lb 9.6 oz (76 kg)   BMI 24.40 kg/m  General:   Alert and oriented. Pleasant and cooperative. Well-nourished and well-developed.  Head:  Normocephalic and atraumatic. Rectal:  Declined Msk:  Symmetrical without gross deformities. Normal posture. Extremities:  Without edema. Neurologic:  Alert and  oriented x4;  grossly normal neurologically.    Assessment/Plan:    Benjamin Goodwin is a 68 y.o. male presenting today with a history of symptomatic hemorrhoids s/p banding of left lateral with good results.   He  still could benefit from further banding, but he would like to hold off on this for now.   Intermittent yeast-like rash perianally. Improvement with clotrimazole  most recently. Ketoconazole  without much improvement. He would like to try another agent in meantime. I suspect further banding could benefit him and help decrease risk of yeast that thrives with the intermittent seepage/prolapsing hemorrhoids.   Will trial mycolog perianally for 1-2 weeks. He can use anusol  internally for 5-7 days at most and then take break. Discussed avoiding use of both simultaneously to avoid increased steroid exposure.  Will see him back in 6 weeks and pursue banding if he does not have improvement overall; he is requesting to hold off  on banding today, which is reasonable.     [name], PhD, ANP-BC [facility]    "

## 2024-08-14 ENCOUNTER — Telehealth: Payer: Self-pay

## 2024-08-14 NOTE — Telephone Encounter (Signed)
 Pt phoned stating he got the 2 creams from the pharmacy but no suppositories were sent in. Where you going to send some in to his pharmacy

## 2024-08-18 NOTE — Telephone Encounter (Signed)
 No, I just did the cream, and it should have a tip on it. If he wants suppositories, let me know.

## 2024-08-19 NOTE — Telephone Encounter (Signed)
 Phoned the pt and advised of instructions to cream. Pt states that the pharmacy had the Nystatin  on back order but he will check back today. Pt also requests a Rx for the suppositories to be sent to the pharmacy also. Pt states it helps things to work better

## 2024-08-25 MED ORDER — HYDROCORTISONE ACETATE 25 MG RE SUPP: 25.0000 mg | Freq: Two times a day (BID) | RECTAL | 1 refills | Status: AC

## 2024-08-25 NOTE — Telephone Encounter (Signed)
 I have sent in the suppositories. Thanks!

## 2024-08-25 NOTE — Addendum Note (Signed)
 Addended by: SHIRLEAN THERISA ORN on: 08/25/2024 10:34 AM   Modules accepted: Orders

## 2024-08-26 NOTE — Telephone Encounter (Signed)
 Phoned and LMOVM for the pt

## 2024-08-28 NOTE — Telephone Encounter (Signed)
Phoned and advised the pt of his Rx being sent to his pharmacy. Pt expressed understanding

## 2024-10-08 ENCOUNTER — Ambulatory Visit: Admitting: Gastroenterology
